# Patient Record
Sex: Female | Born: 1950 | Race: White | Hispanic: No | Smoking: Former smoker
Health system: Southern US, Community
[De-identification: ages and names within clinical notes are randomized; demographics above are authoritative.]

## PROBLEM LIST (undated history)

## (undated) DIAGNOSIS — C801 Malignant (primary) neoplasm, unspecified: Secondary | ICD-10-CM

## (undated) HISTORY — PX: BREAST CYST ASPIRATION: SHX578

## (undated) HISTORY — PX: TUBAL LIGATION: SHX77

## (undated) HISTORY — PX: REPLACEMENT TOTAL KNEE: SUR1224

## (undated) HISTORY — PX: PARTIAL HYSTERECTOMY: SHX80

## (undated) HISTORY — PX: BLADDER SURGERY: SHX569

---

## 1984-07-19 HISTORY — PX: BREAST EXCISIONAL BIOPSY: SUR124

## 2005-03-29 ENCOUNTER — Ambulatory Visit: Payer: Self-pay

## 2006-03-31 ENCOUNTER — Ambulatory Visit: Payer: Self-pay

## 2007-04-03 ENCOUNTER — Ambulatory Visit: Payer: Self-pay

## 2007-09-06 ENCOUNTER — Ambulatory Visit: Payer: Self-pay | Admitting: Internal Medicine

## 2008-04-04 ENCOUNTER — Ambulatory Visit: Payer: Self-pay

## 2008-08-28 ENCOUNTER — Ambulatory Visit: Payer: Self-pay | Admitting: Internal Medicine

## 2009-03-05 ENCOUNTER — Ambulatory Visit: Payer: Self-pay | Admitting: Internal Medicine

## 2009-03-06 ENCOUNTER — Ambulatory Visit: Payer: Self-pay | Admitting: Internal Medicine

## 2009-03-13 ENCOUNTER — Ambulatory Visit: Payer: Self-pay

## 2009-03-14 ENCOUNTER — Ambulatory Visit: Payer: Self-pay

## 2009-05-07 ENCOUNTER — Ambulatory Visit: Payer: Self-pay | Admitting: Gastroenterology

## 2009-05-15 ENCOUNTER — Ambulatory Visit: Payer: Self-pay

## 2009-06-03 ENCOUNTER — Ambulatory Visit: Payer: Self-pay | Admitting: Gastroenterology

## 2009-08-18 ENCOUNTER — Ambulatory Visit: Payer: Self-pay | Admitting: Internal Medicine

## 2009-09-16 ENCOUNTER — Ambulatory Visit: Payer: Self-pay | Admitting: Gastroenterology

## 2009-09-23 ENCOUNTER — Ambulatory Visit: Payer: Self-pay | Admitting: Internal Medicine

## 2009-10-29 ENCOUNTER — Ambulatory Visit: Payer: Self-pay | Admitting: Nurse Practitioner

## 2009-11-28 ENCOUNTER — Ambulatory Visit: Payer: Self-pay | Admitting: Family Medicine

## 2010-04-06 ENCOUNTER — Ambulatory Visit: Payer: Self-pay | Admitting: Internal Medicine

## 2010-05-18 ENCOUNTER — Ambulatory Visit: Payer: Self-pay | Admitting: Nurse Practitioner

## 2011-02-18 ENCOUNTER — Ambulatory Visit: Payer: Self-pay | Admitting: Physical Medicine and Rehabilitation

## 2011-05-20 ENCOUNTER — Ambulatory Visit: Payer: Self-pay | Admitting: Family Medicine

## 2011-06-26 ENCOUNTER — Ambulatory Visit: Payer: Self-pay

## 2011-08-23 ENCOUNTER — Ambulatory Visit: Payer: Self-pay | Admitting: Sports Medicine

## 2011-09-13 ENCOUNTER — Ambulatory Visit: Payer: Self-pay | Admitting: Anesthesiology

## 2011-09-13 DIAGNOSIS — I10 Essential (primary) hypertension: Secondary | ICD-10-CM

## 2011-09-16 ENCOUNTER — Ambulatory Visit: Payer: Self-pay | Admitting: Orthopedic Surgery

## 2012-05-24 ENCOUNTER — Ambulatory Visit: Payer: Self-pay | Admitting: Nurse Practitioner

## 2012-05-30 ENCOUNTER — Ambulatory Visit: Payer: Self-pay | Admitting: Nurse Practitioner

## 2012-06-22 ENCOUNTER — Ambulatory Visit: Payer: Self-pay | Admitting: Family Medicine

## 2012-06-26 ENCOUNTER — Ambulatory Visit: Payer: Self-pay | Admitting: Surgery

## 2012-06-26 HISTORY — PX: BREAST BIOPSY: SHX20

## 2013-02-07 ENCOUNTER — Ambulatory Visit: Payer: Self-pay | Admitting: Surgery

## 2013-11-05 ENCOUNTER — Ambulatory Visit: Payer: Self-pay | Admitting: Physician Assistant

## 2013-11-29 ENCOUNTER — Ambulatory Visit: Payer: Self-pay | Admitting: Nurse Practitioner

## 2014-11-10 NOTE — Op Note (Signed)
PATIENT NAME:  Tina Zuniga, Tina Zuniga MR#:  [id number] DATE OF BIRTH:  1950/12/22  DATE OF PROCEDURE:  09/16/2011  PREOPERATIVE DIAGNOSES:  1. Left knee arthritis. 2. Medial meniscus tear.   POSTOPERATIVE DIAGNOSES:  1. Left knee arthritis. 2. Medial meniscus tear.   PROCEDURES PERFORMED:  1. Left knee arthroscopy. 2. Partial medial meniscectomy.   SURGEON: Laurene Footman, MD  ANESTHESIA: General.  DESCRIPTION OF PROCEDURE: Patient brought to the Operating Room and after adequate anesthesia was obtained left leg was placed in the arthroscopic legholder, prepped and draped in the usual sterile fashion. After patient identification procedures were complete an inferolateral portal was made and the arthroscope was introduced. Initial inspection revealed some mild patellofemoral degenerative change. There was some scar tissue in the medial gutter related to prior open procedure. This was subsequently debrided as it might have been impinging, was not really a plica, just scar in the pouch. Next, the inferomedial portal was made; she had a prior medial arthrotomy and this incision was avoided. On probing there was a complex tear of the posterior horn of the meniscus. There was also an approximately 5 mm flap of articular cartilage on the distal aspect of the femoral condyle. The meniscal tear was addressed with meniscal punch and ArthroCare wand and the chondral flap was also debrided. The meniscus was addressed and after it was deemed stable that portion of the procedure was completed. The articular cartilage on the tibia showed some early degenerative changes with loss of the superficial layer. There was not fissuring, however, and other than the chondral flap on the femoral condyle there were areas of fissuring but no significant thickness loss of the medial femoral condyle. The anterior cruciate ligament was intact. The lateral compartment was essentially normal. After thoroughly irrigating out the knee  the gutters were checked. There were no loose bodies. The patella tracked well and after thorough irrigation of the joint all instrumentation was withdrawn. Dermabond was used to close the wounds. 20 mL of 0.5% Sensorcaine with epinephrine was infiltrated into the portals for postoperative analgesia. The wound was dressed with 4 x 4's, Webril, and Ace wrap and sent to recovery in stable condition.    ____________________________ Laurene Footman, MD mjm:cms D: 09/16/2011 16:19:32 ET T: 09/16/2011 17:01:34 ET JOB#: [id number]  cc: Laurene Footman, MD, <Dictator> Laurene Footman MD ELECTRONICALLY SIGNED 09/16/2011 17:24

## 2014-11-26 ENCOUNTER — Other Ambulatory Visit: Payer: Self-pay | Admitting: Nurse Practitioner

## 2014-11-26 DIAGNOSIS — Z1231 Encounter for screening mammogram for malignant neoplasm of breast: Secondary | ICD-10-CM

## 2014-12-02 ENCOUNTER — Ambulatory Visit
Admission: RE | Admit: 2014-12-02 | Discharge: 2014-12-02 | Disposition: A | Discharge status: Home / Self Care | Payer: Federal, State, Local not specified - PPO | Source: Ambulatory Visit | Attending: Nurse Practitioner | Admitting: Nurse Practitioner

## 2014-12-02 DIAGNOSIS — Z1231 Encounter for screening mammogram for malignant neoplasm of breast: Secondary | ICD-10-CM | POA: Diagnosis present

## 2014-12-02 HISTORY — DX: Malignant (primary) neoplasm, unspecified: C80.1

## 2015-10-31 ENCOUNTER — Other Ambulatory Visit: Payer: Self-pay | Admitting: Nurse Practitioner

## 2015-10-31 DIAGNOSIS — Z1231 Encounter for screening mammogram for malignant neoplasm of breast: Secondary | ICD-10-CM

## 2015-12-04 ENCOUNTER — Ambulatory Visit
Admission: RE | Admit: 2015-12-04 | Discharge: 2015-12-04 | Disposition: A | Discharge status: Home / Self Care | Payer: Federal, State, Local not specified - PPO | Source: Ambulatory Visit | Attending: Nurse Practitioner | Admitting: Nurse Practitioner

## 2015-12-04 DIAGNOSIS — Z1231 Encounter for screening mammogram for malignant neoplasm of breast: Secondary | ICD-10-CM | POA: Insufficient documentation

## 2016-09-16 ENCOUNTER — Emergency Department
Admission: EM | Admit: 2016-09-16 | Discharge: 2016-09-16 | Disposition: A | Discharge status: Home / Self Care | Payer: Federal, State, Local not specified - PPO | Attending: Emergency Medicine | Admitting: Emergency Medicine

## 2016-09-16 ENCOUNTER — Encounter: Payer: Self-pay | Admitting: Emergency Medicine

## 2016-09-16 DIAGNOSIS — K2970 Gastritis, unspecified, without bleeding: Secondary | ICD-10-CM | POA: Diagnosis not present

## 2016-09-16 DIAGNOSIS — R1012 Left upper quadrant pain: Secondary | ICD-10-CM | POA: Diagnosis present

## 2016-09-16 DIAGNOSIS — Z85820 Personal history of malignant melanoma of skin: Secondary | ICD-10-CM | POA: Insufficient documentation

## 2016-09-16 DIAGNOSIS — Z79899 Other long term (current) drug therapy: Secondary | ICD-10-CM | POA: Insufficient documentation

## 2016-09-16 LAB — URINALYSIS, COMPLETE (UACMP) WITH MICROSCOPIC
Bilirubin Urine: NEGATIVE
GLUCOSE, UA: NEGATIVE mg/dL
Hgb urine dipstick: NEGATIVE
Ketones, ur: NEGATIVE mg/dL
Nitrite: NEGATIVE
PH: 6 (ref 5.0–8.0)
Protein, ur: NEGATIVE mg/dL
Specific Gravity, Urine: 1.006 (ref 1.005–1.030)

## 2016-09-16 LAB — COMPREHENSIVE METABOLIC PANEL
ALT: 24 U/L (ref 14–54)
AST: 24 U/L (ref 15–41)
Albumin: 4.2 g/dL (ref 3.5–5.0)
Alkaline Phosphatase: 64 U/L (ref 38–126)
Anion gap: 8 (ref 5–15)
BUN: 16 mg/dL (ref 6–20)
CO2: 29 mmol/L (ref 22–32)
Calcium: 9.3 mg/dL (ref 8.9–10.3)
Chloride: 101 mmol/L (ref 101–111)
Creatinine, Ser: 0.78 mg/dL (ref 0.44–1.00)
GFR calc Af Amer: >60 mL/min (ref >60)
GFR calc non Af Amer: >60 mL/min (ref >60)
Glucose, Bld: 98 mg/dL (ref 65–99)
Potassium: 3.5 mmol/L (ref 3.5–5.1)
Sodium: 138 mmol/L (ref 135–145)
Total Bilirubin: 0.8 mg/dL (ref 0.3–1.2)
Total Protein: 7.7 g/dL (ref 6.5–8.1)

## 2016-09-16 LAB — LIPASE, BLOOD: LIPASE: 39 U/L (ref 11–51)

## 2016-09-16 LAB — CBC
HCT: 38.8 % (ref 35.0–47.0)
Hemoglobin: 13 g/dL (ref 12.0–16.0)
MCH: 28.8 pg (ref 26.0–34.0)
MCHC: 33.4 g/dL (ref 32.0–36.0)
MCV: 86.2 fL (ref 80.0–100.0)
Platelets: 338 10*3/uL (ref 150–440)
RBC: 4.5 MIL/uL (ref 3.80–5.20)
RDW: 14.4 % (ref 11.5–14.5)
WBC: 7.5 10*3/uL (ref 3.6–11.0)

## 2016-09-16 LAB — TROPONIN I: Troponin I: <0.03 ng/mL (ref <0.03)

## 2016-09-16 MED ORDER — FAMOTIDINE 40 MG PO TABS: 40.0000 mg | ORAL_TABLET | Freq: Every evening | ORAL | 1 refills | Status: AC

## 2016-09-16 MED ORDER — SUCRALFATE 1 G PO TABS: 1.0000 g | ORAL_TABLET | Freq: Four times a day (QID) | ORAL | 0 refills | Status: AC

## 2016-09-16 MED ORDER — GI COCKTAIL ~~LOC~~
30.0000 mL | Freq: Once | ORAL | Status: AC
  Administered 2016-09-16: 30 mL via ORAL
  Filled 2016-09-16: qty 30

## 2016-09-16 NOTE — ED Notes (Signed)
ED Provider at bedside. 

## 2016-09-16 NOTE — ED Provider Notes (Signed)
[facility] Emergency Department Provider Note   ____________________________________________   I have reviewed the triage vital signs and the nursing notes.   HISTORY  Chief Complaint Abdominal Pain   History limited by: Not Limited   HPI Tina Zuniga is a 66 y.o. female who presents to the emergency department today because of concerns for abdominal pain. Is located in the left upper quadrant. It started 3 days ago after the patient ate. Has progressively been getting worse. It is worse after the patient eats. She states today the pain was constant. She describes it as sharp. It has been associated with nausea. No radiation. No change in defecation. No fevers.     Past Medical History:  Diagnosis Date  . Cancer ([place])    melanoma    There are no active problems to display for this patient.   Past Surgical History:  Procedure Laterality Date  . BLADDER SURGERY    . BREAST BIOPSY Right 06/26/2012   negative  . BREAST CYST ASPIRATION Left    negative  . PARTIAL HYSTERECTOMY    . REPLACEMENT TOTAL KNEE Left   . TUBAL LIGATION      Prior to Admission medications   Not on File    Allergies Codeine  No family history on file.  Social History No alcohol use No smoking  Review of Systems  Constitutional: Negative for fever. Cardiovascular: Negative for chest pain. Respiratory: Negative for shortness of breath. Gastrointestinal: Positive for left upper quadrant pain Genitourinary: Negative for dysuria. Musculoskeletal: Negative for back pain. Skin: Negative for rash. Neurological: Negative for headaches, focal weakness or numbness.  10-point ROS otherwise negative.  ____________________________________________   PHYSICAL EXAM:  VITAL SIGNS: ED Triage Vitals [09/16/16 1811]  Enc Vitals Group     BP 145/79     Pulse 85     Resp 16     Temp 98.2     Temp src      SpO2 99     Weight 183 lb (83 kg)     Height 5\' 1"   (1.549 m)     Head Circumference      Peak Flow      Pain Score 10   Constitutional: Alert and oriented. Well appearing and in no distress. Eyes: Conjunctivae are normal. Normal extraocular movements. ENT   Head: Normocephalic and atraumatic.   Nose: No congestion/rhinnorhea.   Mouth/Throat: Mucous membranes are moist.   Neck: No stridor. Hematological/Lymphatic/Immunilogical: No cervical lymphadenopathy. Cardiovascular: Normal rate, regular rhythm.  No murmurs, rubs, or gallops.  Respiratory: Normal respiratory effort without tachypnea nor retractions. Breath sounds are clear and equal bilaterally. No wheezes/rales/rhonchi. Gastrointestinal: Soft and non tender. No rebound. No guarding.  Genitourinary: Deferred Musculoskeletal: Normal range of motion in all extremities. No lower extremity edema. Neurologic:  Normal speech and language. No gross focal neurologic deficits are appreciated.  Skin:  Skin is warm, dry and intact. No rash noted. Psychiatric: Mood and affect are normal. Speech and behavior are normal. Patient exhibits appropriate insight and judgment.  ____________________________________________    LABS (pertinent positives/negatives)  Labs Reviewed  URINALYSIS, COMPLETE (UACMP) WITH MICROSCOPIC - Abnormal; Notable for the following:       Result Value   Color, Urine STRAW (*)    APPearance CLEAR (*)    Leukocytes, UA TRACE (*)    Bacteria, UA RARE (*)    Squamous Epithelial / LPF 0-5 (*)    All other components within normal limits  LIPASE, BLOOD  COMPREHENSIVE METABOLIC PANEL  CBC  TROPONIN I     ____________________________________________   EKG  None  ____________________________________________    RADIOLOGY  None  ____________________________________________   PROCEDURES  Procedures  ____________________________________________   INITIAL IMPRESSION / ASSESSMENT AND PLAN / ED COURSE  Pertinent labs & imaging results that  were available during my care of the patient were reviewed by me and considered in my medical decision making (see chart for details).  Patient presented to the emergency department today because of concerns for left upper quadrant pain. This has been going on for 2 days. On exam abdomen is benign. Blood work without any concerning findings. The patient did get improvement after a GI cocktail. At this point I think gastritis likely. Discussed with patient did not think any emergent abdominal imaging is required at this time. Will plan on discharging with an antiacid and sucralfate.  ____________________________________________   FINAL CLINICAL IMPRESSION(S) / ED DIAGNOSES  Final diagnoses:  Gastritis without bleeding, unspecified chronicity, unspecified gastritis type     Note: This dictation was prepared with Dragon dictation. Any transcriptional errors that result from this process are unintentional     Nance Pear, MD 09/16/16 2245

## 2016-09-16 NOTE — Discharge Instructions (Signed)
Please seek medical attention for any high fevers, chest pain, shortness of breath, change in behavior, persistent vomiting, bloody stool or any other new or concerning symptoms.  

## 2016-09-16 NOTE — ED Notes (Signed)
D/c inst to pt.   

## 2016-09-16 NOTE — ED Triage Notes (Signed)
Pt reports LUQ pain x2 days, reports pain has been consistent today. Pt reports nausea today, reports pain is worse after eating.

## 2016-10-22 ENCOUNTER — Other Ambulatory Visit: Payer: Self-pay | Admitting: Nurse Practitioner

## 2016-10-22 DIAGNOSIS — Z1231 Encounter for screening mammogram for malignant neoplasm of breast: Secondary | ICD-10-CM

## 2016-12-06 ENCOUNTER — Ambulatory Visit
Admission: RE | Admit: 2016-12-06 | Discharge: 2016-12-06 | Disposition: A | Discharge status: Home / Self Care | Payer: Medicare Other | Source: Ambulatory Visit | Attending: Nurse Practitioner | Admitting: Nurse Practitioner

## 2016-12-06 DIAGNOSIS — Z1231 Encounter for screening mammogram for malignant neoplasm of breast: Secondary | ICD-10-CM | POA: Insufficient documentation

## 2017-02-02 ENCOUNTER — Other Ambulatory Visit
Admission: RE | Admit: 2017-02-02 | Discharge: 2017-02-02 | Disposition: A | Discharge status: Home / Self Care | Payer: Federal, State, Local not specified - PPO | Source: Ambulatory Visit | Attending: Unknown Physician Specialty | Admitting: Unknown Physician Specialty

## 2017-02-02 DIAGNOSIS — R42 Dizziness and giddiness: Secondary | ICD-10-CM | POA: Insufficient documentation

## 2017-02-02 LAB — BASIC METABOLIC PANEL
ANION GAP: 9 (ref 5–15)
BUN: 19 mg/dL (ref 6–20)
CALCIUM: 9.3 mg/dL (ref 8.9–10.3)
CO2: 29 mmol/L (ref 22–32)
CREATININE: 0.74 mg/dL (ref 0.44–1.00)
Chloride: 104 mmol/L (ref 101–111)
GLUCOSE: 98 mg/dL (ref 65–99)
Potassium: 3.5 mmol/L (ref 3.5–5.1)
Sodium: 142 mmol/L (ref 135–145)

## 2017-02-03 ENCOUNTER — Emergency Department: Payer: Federal, State, Local not specified - PPO

## 2017-02-03 ENCOUNTER — Emergency Department
Admission: EM | Admit: 2017-02-03 | Discharge: 2017-02-03 | Disposition: A | Discharge status: Home / Self Care | Payer: Federal, State, Local not specified - PPO | Attending: Emergency Medicine | Admitting: Emergency Medicine

## 2017-02-03 DIAGNOSIS — Z79899 Other long term (current) drug therapy: Secondary | ICD-10-CM | POA: Insufficient documentation

## 2017-02-03 DIAGNOSIS — R55 Syncope and collapse: Secondary | ICD-10-CM

## 2017-02-03 DIAGNOSIS — H02402 Unspecified ptosis of left eyelid: Secondary | ICD-10-CM | POA: Insufficient documentation

## 2017-02-03 DIAGNOSIS — H538 Other visual disturbances: Secondary | ICD-10-CM

## 2017-02-03 DIAGNOSIS — Z87891 Personal history of nicotine dependence: Secondary | ICD-10-CM | POA: Insufficient documentation

## 2017-02-03 DIAGNOSIS — Z96652 Presence of left artificial knee joint: Secondary | ICD-10-CM | POA: Insufficient documentation

## 2017-02-03 LAB — URINALYSIS, COMPLETE (UACMP) WITH MICROSCOPIC
BACTERIA UA: NONE SEEN
BILIRUBIN URINE: NEGATIVE
Glucose, UA: NEGATIVE mg/dL
HGB URINE DIPSTICK: NEGATIVE
Ketones, ur: NEGATIVE mg/dL
NITRITE: NEGATIVE
PROTEIN: NEGATIVE mg/dL
SPECIFIC GRAVITY, URINE: 1.004 — AB (ref 1.005–1.030)
pH: 7 (ref 5.0–8.0)

## 2017-02-03 LAB — BASIC METABOLIC PANEL
ANION GAP: 10 (ref 5–15)
BUN: 14 mg/dL (ref 6–20)
CO2: 27 mmol/L (ref 22–32)
Calcium: 9.6 mg/dL (ref 8.9–10.3)
Chloride: 102 mmol/L (ref 101–111)
Creatinine, Ser: 0.66 mg/dL (ref 0.44–1.00)
GLUCOSE: 99 mg/dL (ref 65–99)
POTASSIUM: 3.4 mmol/L — AB (ref 3.5–5.1)
Sodium: 139 mmol/L (ref 135–145)

## 2017-02-03 LAB — CBC
HEMATOCRIT: 38.4 % (ref 35.0–47.0)
HEMOGLOBIN: 13.1 g/dL (ref 12.0–16.0)
MCH: 28.7 pg (ref 26.0–34.0)
MCHC: 34 g/dL (ref 32.0–36.0)
MCV: 84.4 fL (ref 80.0–100.0)
Platelets: 378 10*3/uL (ref 150–440)
RBC: 4.55 MIL/uL (ref 3.80–5.20)
RDW: 15.4 % — ABNORMAL HIGH (ref 11.5–14.5)
WBC: 8.5 10*3/uL (ref 3.6–11.0)

## 2017-02-03 LAB — TROPONIN I

## 2017-02-03 MED ORDER — ASPIRIN 81 MG PO CHEW
324.0000 mg | CHEWABLE_TABLET | Freq: Once | ORAL | Status: AC
  Administered 2017-02-03: 324 mg via ORAL
  Filled 2017-02-03: qty 4

## 2017-02-03 NOTE — ED Notes (Signed)
Pt updated and verbalized feeling safe being discharged to follow up tomorrow. PT able to ambulate independently and is in NAD at the time of discharge. PT able to ambulate to parking lot independently

## 2017-02-03 NOTE — ED Notes (Signed)
Dizzy x4 days , blurred vision today , seen at "walk in yesterday for same"

## 2017-02-03 NOTE — ED Notes (Signed)
EDP at bedside  

## 2017-02-03 NOTE — ED Notes (Signed)
Security notified of need to call MRI tech

## 2017-02-03 NOTE — ED Triage Notes (Signed)
Pt reports to ED w/ c/o multiple LOC x 2 days. Pt sts that she has had 3 episodes of LOC in the past 2 days, pt reports feeling dizzy at this time. Pt A/OX4, resp even and unlabored. Pt reports L eyelid drooping at 1200 today, no drooping noted by this RN. Pts stroke screen negative. MAE. NAD

## 2017-02-03 NOTE — ED Notes (Signed)
Pt on phone with MRI for screening 

## 2017-02-03 NOTE — ED Notes (Signed)
PT back from MRI. NAD noted at this time.

## 2017-02-03 NOTE — ED Provider Notes (Addendum)
[facility] Emergency Department Provider Note  ____________________________________________   First MD Initiated Contact with Patient 02/03/17 1819     (approximate)  I have reviewed the triage vital signs and the nursing notes.   HISTORY  Chief Complaint Loss of Consciousness and Eye Problem   HPI Tina Zuniga is a 66 y.o. female who is presenting to the emergency Department today with left-sided blurred vision as well as left-sided eye drooping. She is also complaining of one episode of syncope 2 days ago as well as intermittent feelings of lightheadedness since then. She says that the first episode of syncope occurred when she was sitting up in bed 2 days ago after waking up in the morning. Says that she sat up and felt lightheaded and then came diaphoretic and lost consciousness. She is unsure how long the loss of consciousness lasted. She then said that she has had several other episodes of lightheadedness but without any syncope since then. She is denying any chest pain. However, she said that her left eye began to have blurred vision and that other people at work were noticing a left-sided eye droop at about noon today.The patient denies any pain to her eye. Denies any double vision but says that the vision is blurred left eye. Does not report bright light intolerance.   Past Medical History:  Diagnosis Date  . Cancer ([place])    melanoma    There are no active problems to display for this patient.   Past Surgical History:  Procedure Laterality Date  . BLADDER SURGERY    . BREAST BIOPSY Right 06/26/2012   negative  . BREAST CYST ASPIRATION Left    negative  . PARTIAL HYSTERECTOMY    . REPLACEMENT TOTAL KNEE Left   . TUBAL LIGATION      Prior to Admission medications   Medication Sig Start Date End Date Taking? Authorizing Provider  cetirizine (ZYRTEC) 10 MG tablet Take 10 mg by mouth at bedtime.   Yes [provider]    Cholecalciferol (VITAMIN D3) 1000 units CAPS Take 1,000 Units by mouth daily.   Yes [provider]  CRANBERRY PO Take 1 tablet by mouth daily.   Yes [provider]  furosemide (LASIX) 20 MG tablet Take 10 mg by mouth as needed.   Yes [provider]  Multiple Vitamin (MULTI-VITAMINS) TABS Take 1 tablet by mouth daily.   Yes [provider]  nortriptyline (PAMELOR) 50 MG capsule Take 50 mg by mouth at bedtime. 08/31/16  Yes [provider]  Potassium 99 MG TABS Take 1 tablet by mouth daily.   Yes [provider]  Propylene Glycol (SYSTANE BALANCE OP) Apply 1 drop to eye as needed.   Yes [provider]  simvastatin (ZOCOR) 10 MG tablet Take 10 mg by mouth daily at 6 PM.  09/12/16  Yes [provider]  triamterene-hydrochlorothiazide (MAXZIDE-25) 37.5-25 MG tablet Take 1 tablet by mouth daily. 09/01/16  Yes [provider]  valACYclovir (VALTREX) 500 MG tablet Take 500 mg by mouth 2 (two) times daily as needed. 08/31/16  Yes [provider]  vitamin B-12 (CYANOCOBALAMIN) 1000 MCG tablet Take 1,000 mcg by mouth daily.   Yes [provider]  cycloSPORINE (RESTASIS) 0.05 % ophthalmic emulsion Place 1 drop into both eyes 2 (two) times daily.    [provider]  famotidine (PEPCID) 40 MG tablet Take 1 tablet (40 mg total) by mouth every evening. Patient not taking: Reported on  02/03/2017 09/16/16 09/16/17  Nance Pear, MD  sucralfate (CARAFATE) 1 g tablet Take 1 tablet (1 g total) by mouth 4 (four) times daily. Patient not taking: Reported on 02/03/2017 09/16/16   Nance Pear, MD    Allergies Codeine  No family history on file.  Social History Social History  Substance Use Topics  . Smoking status: Former Research scientist (life sciences)  . Smokeless tobacco: Never Used  . Alcohol use Not on file    Review of Systems  Constitutional: No fever/chills Eyes: as above ENT: No sore throat. Cardiovascular:  Denies chest pain. Respiratory: Denies shortness of breath. Gastrointestinal: No abdominal pain.  No nausea, no vomiting.  No diarrhea.  No constipation. Genitourinary: Negative for dysuria. Musculoskeletal: Negative for back pain. Skin: Negative for rash. Neurological: Negative for headaches or numbness.   ____________________________________________   PHYSICAL EXAM:  VITAL SIGNS: ED Triage Vitals  Enc Vitals Group     BP 02/03/17 1619 (!) 158/98     Pulse Rate 02/03/17 1619 75     Resp 02/03/17 1619 (!) 22     Temp 02/03/17 1619 98.3 F (36.8 C)     Temp Source 02/03/17 1619 Oral     SpO2 02/03/17 1619 98 %     Weight 02/03/17 1620 190 lb (86.2 kg)     Height 02/03/17 1620 5\' 1"  (1.549 m)     Head Circumference --      Peak Flow --      Pain Score 02/03/17 1626 2     Pain Loc --      Pain Edu? --      Excl. in [place]? --     Constitutional: Alert and oriented. Well appearing and in no acute distress. Eyes: Conjunctivae are normal. PERRLA. Left eye is 20/200 with vision corrected in the right eye is 20/40 with the vision corrected by glasses. Head: Atraumatic.Normal TMs bilaterally without lesions Nose: No congestion/rhinnorhea. Mouth/Throat: Mucous membranes are moist.  Neck: No stridor.   Cardiovascular: Normal rate, regular rhythm. Grossly normal heart sounds.  Good peripheral circulation. Respiratory: Normal respiratory effort.  No retractions. Lungs CTAB. Gastrointestinal: Soft and nontender. No distention.  Musculoskeletal: No lower extremity tenderness nor edema.  No joint effusions. Neurologic:  Normal speech and language. No gross focal neurologic deficits are appreciated. I do not see any asymmetry in the patient's face.  To me, the patient does not appear to have a central or peripheral left-sided cranial nerve VII palsy. However, she says that when she looks in the mirror and appears that her left eye is drooping and she is feeling that she cannot fully open her  left eyelid.  Skin:  Skin is warm, dry and intact. No rash noted. Psychiatric: Mood and affect are normal. Speech and behavior are normal.  NIH Stroke Scale  Person Administering Scale: Doran Stabler  Administer stroke scale items in the order listed. Record performance in each category after each subscale exam. Do not go back and change scores. Follow directions provided for each exam technique. Scores should reflect what the patient does, not what the clinician thinks the patient can do. The clinician should record answers while administering the exam and work quickly. Except where indicated, the patient should not be coached (i.e., repeated requests to patient to make a special effort).   1a  Level of consciousness: 0=alert; keenly responsive  1b. LOC questions:  0=Performs both tasks correctly  1c. LOC commands: 0=Performs both tasks correctly  2.  Best Gaze: 0=normal  3.  Visual: 0=No visual loss  4. Facial Palsy: 0=Normal symmetric movement  5a.  Motor left arm: 0=No drift, limb holds 90 (or 45) degrees for full 10 seconds  5b.  Motor right arm: 0=No drift, limb holds 90 (or 45) degrees for full 10 seconds  6a. motor left leg: 0=No drift, limb holds 90 (or 45) degrees for full 10 seconds  6b  Motor right leg:  0=No drift, limb holds 90 (or 45) degrees for full 10 seconds  7. Limb Ataxia: 0=Absent  8.  Sensory: 0=Normal; no sensory loss  9. Best Language:  0=No aphasia, normal  10. Dysarthria: 0=Normal  11. Extinction and Inattention: 0=No abnormality  12. Distal motor function: 0=Normal   Total:   0    ____________________________________________   LABS (all labs ordered are listed, but only abnormal results are displayed)  Labs Reviewed  BASIC METABOLIC PANEL - Abnormal; Notable for the following:       Result Value   Potassium 3.4 (*)    All other components within normal limits  CBC - Abnormal; Notable for the following:    RDW 15.4 (*)    All other  components within normal limits  URINALYSIS, COMPLETE (UACMP) WITH MICROSCOPIC - Abnormal; Notable for the following:    Color, Urine STRAW (*)    APPearance CLEAR (*)    Specific Gravity, Urine 1.004 (*)    Leukocytes, UA TRACE (*)    Squamous Epithelial / LPF 0-5 (*)    All other components within normal limits  TROPONIN I  CBG MONITORING, ED   ____________________________________________  EKG  ED ECG REPORT I, Doran Stabler, the attending physician, personally viewed and interpreted this ECG.   Date: 02/03/2017  EKG Time: 1614  Rate: 73  Rhythm: normal sinus rhythm  Axis: Normal  Intervals:none  ST&T Change: No ST segment elevation or depression. No abnormal T-wave inversion.  ____________________________________________  RADIOLOGY  No acute finding on the CT of the brain.  No acute finding on the MRI of the brain. ____________________________________________   PROCEDURES  Procedure(s) performed:   Procedures  Critical Care performed:   ____________________________________________   INITIAL IMPRESSION / ASSESSMENT AND PLAN / ED COURSE  Pertinent labs & imaging results that were available during my care of the patient were reviewed by me and considered in my medical decision making (see chart for details).  ----------------------------------------- 10:09 PM on 02/03/2017 -----------------------------------------  Patient persistently is without any weakness or numbness. I continue to observe an intact cranial nerve VII. She says that her vision is slightly improved although still blurred the left eye. I discussed the case with Dr. Wallace Going of ophthalmology and he agrees to the patient in his office tomorrow at 8 AM. I discussed this plan with the patient as well as the lab and imaging results and she is understanding and will comply will be discharged home. We also discussed the syncopal episodes and she'll be following up at her doctor's office this  coming Wednesday. She had a reassuring cardiac workup. Possible vasovagal episodes.      ____________________________________________   FINAL CLINICAL IMPRESSION(S) / ED DIAGNOSES  Syncope. Blurred vision.    NEW MEDICATIONS STARTED DURING THIS VISIT:  New Prescriptions   No medications on file     Note:  This document was prepared using Dragon voice recognition software and may include unintentional dictation errors.     Orbie Pyo, MD 02/03/17 [place], Randall An, MD 02/03/17  2333  

## 2017-02-04 ENCOUNTER — Other Ambulatory Visit
Admission: RE | Admit: 2017-02-04 | Discharge: 2017-02-04 | Disposition: A | Discharge status: Home / Self Care | Payer: Federal, State, Local not specified - PPO | Source: Ambulatory Visit | Attending: Ophthalmology | Admitting: Ophthalmology

## 2017-02-04 ENCOUNTER — Other Ambulatory Visit: Payer: Self-pay | Admitting: Ophthalmology

## 2017-02-04 DIAGNOSIS — R51 Headache: Secondary | ICD-10-CM | POA: Diagnosis not present

## 2017-02-04 DIAGNOSIS — H539 Unspecified visual disturbance: Secondary | ICD-10-CM | POA: Diagnosis not present

## 2017-02-04 DIAGNOSIS — H547 Unspecified visual loss: Secondary | ICD-10-CM

## 2017-02-04 LAB — C-REACTIVE PROTEIN

## 2017-02-04 LAB — SEDIMENTATION RATE: Sed Rate: 52 mm/hr — ABNORMAL HIGH (ref 0–30)

## 2017-02-07 ENCOUNTER — Ambulatory Visit
Admission: RE | Admit: 2017-02-07 | Discharge: 2017-02-07 | Disposition: A | Discharge status: Home / Self Care | Payer: Federal, State, Local not specified - PPO | Source: Ambulatory Visit | Attending: Ophthalmology | Admitting: Ophthalmology

## 2017-02-07 DIAGNOSIS — H547 Unspecified visual loss: Secondary | ICD-10-CM

## 2017-02-07 DIAGNOSIS — H53129 Transient visual loss, unspecified eye: Secondary | ICD-10-CM | POA: Diagnosis not present

## 2017-11-10 ENCOUNTER — Other Ambulatory Visit: Payer: Self-pay | Admitting: Nurse Practitioner

## 2017-11-10 DIAGNOSIS — Z1231 Encounter for screening mammogram for malignant neoplasm of breast: Secondary | ICD-10-CM

## 2017-12-07 ENCOUNTER — Ambulatory Visit
Admission: RE | Admit: 2017-12-07 | Discharge: 2017-12-07 | Disposition: A | Discharge status: Home / Self Care | Payer: Federal, State, Local not specified - PPO | Source: Ambulatory Visit | Attending: Nurse Practitioner | Admitting: Nurse Practitioner

## 2017-12-07 DIAGNOSIS — Z1231 Encounter for screening mammogram for malignant neoplasm of breast: Secondary | ICD-10-CM

## 2018-06-11 IMAGING — MR MR HEAD W/O CM
10 series · 40 of 48 positions shown · non-contrast
Comparison: 02/03/2017 CT head.  11/28/2009 MRI head.

CLINICAL DATA: 66 y/o  F; left-sided facial droop.

EXAM:
MRI HEAD WITHOUT CONTRAST
TECHNIQUE: Multiplanar, multiecho pulse sequences of the brain and surrounding
structures were obtained without intravenous contrast.

[Series 2: T1 · sagittal · 5.0mm · 0.47mm/px · 2 of 21 slices shown (1 of 2)]
[im 1/21]
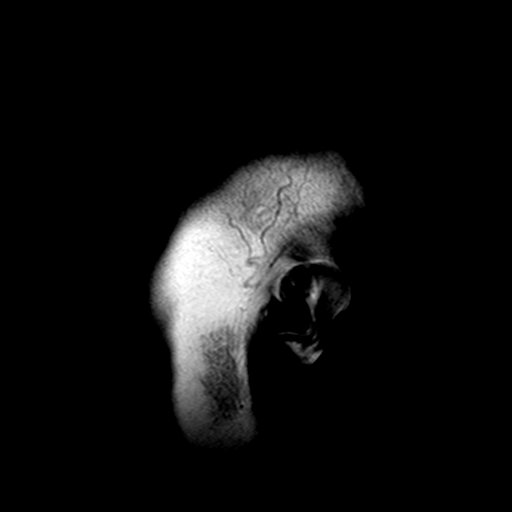
[im 21/21]
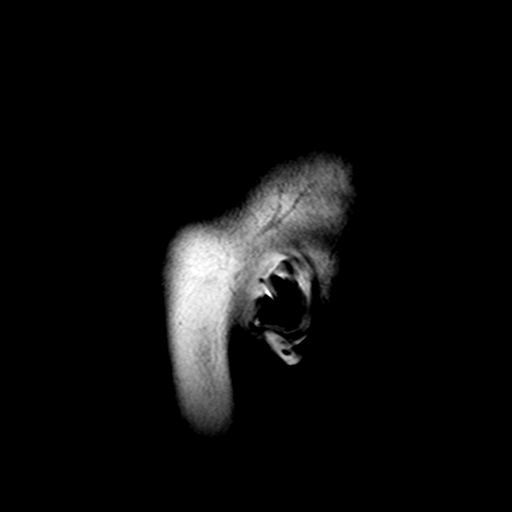

[Series 4: DWI · axial · 3.0mm · 0.94mm/px · z∈[-52,+91]mm · 5 of 50 slices shown (1 of 2)]
[im 1/50]
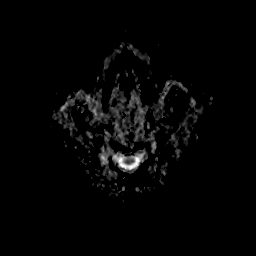
[im 13/50]
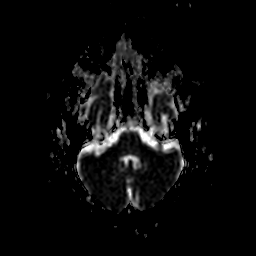
[im 25/50]
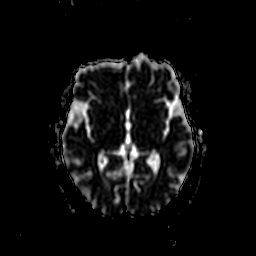
[im 37/50]
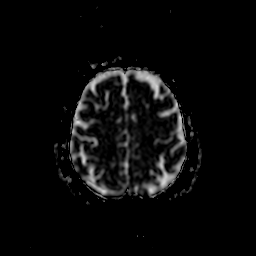
[im 50/50]
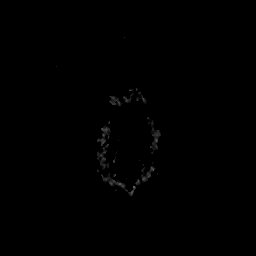

[Series 5: ax (id) · axial · 3.0mm · 0.94mm/px · z∈[-52,+91]mm · 5 of 50 slices shown]
[im 1/50]
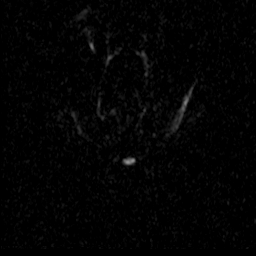
[im 13/50]
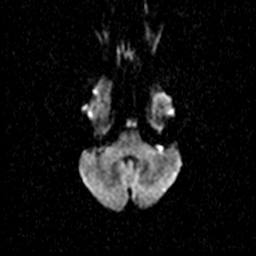
[im 25/50]
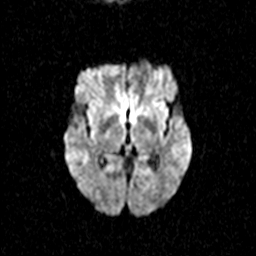
[im 37/50]
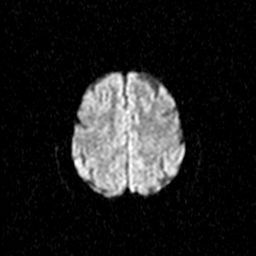
[im 50/50]
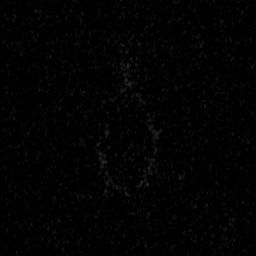

[Series 7: DWI · coronal · 5.0mm · 1.80mm/px · 4 of 39 slices shown (2 of 2)]
[im 1/39]
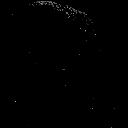
[im 13/39]
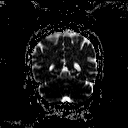
[im 26/39]
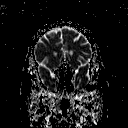
[im 39/39]
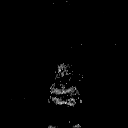

[Series 8: T2 · axial · 5.0mm · 0.45mm/px · z∈[-56,+93]mm · 2 of 23 slices shown (1 of 3)]
[im 1/23]
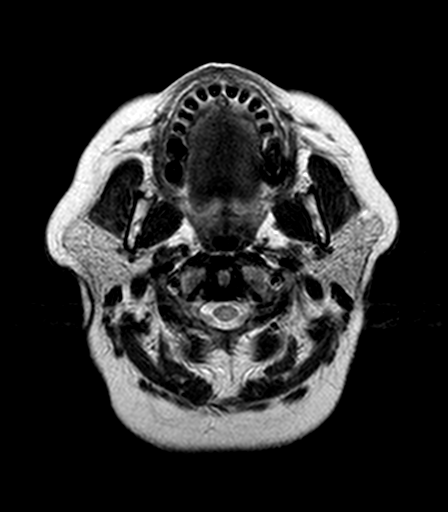
[im 23/23]
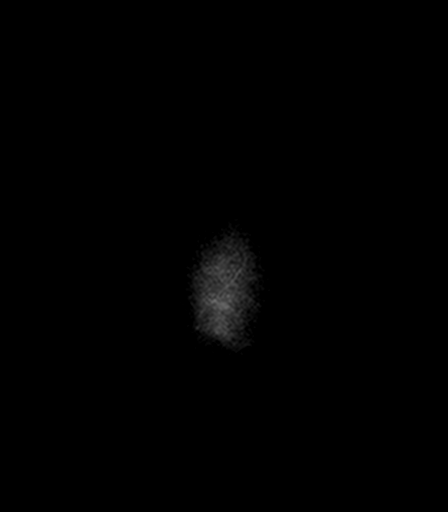

[Series 9: cor (id) · coronal · 5.0mm · 1.80mm/px · 4 of 38 slices shown]
[im 1/38]
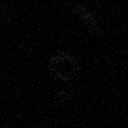
[im 13/38]
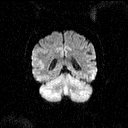
[im 25/38]
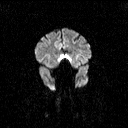
[im 38/38]
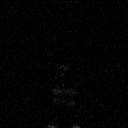

[Series 10: FLAIR · axial · 3.0mm · 0.90mm/px · z∈[-55,+87]mm · 5 of 50 slices shown]
[im 1/50]
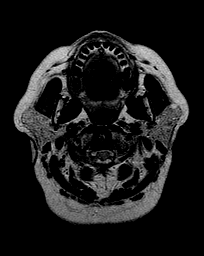
[im 13/50]
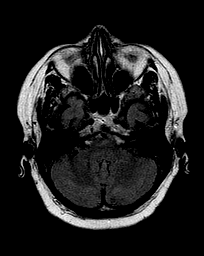
[im 25/50]
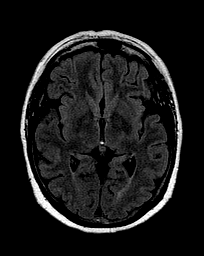
[im 37/50]
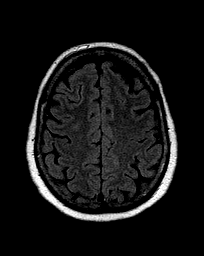
[im 50/50]
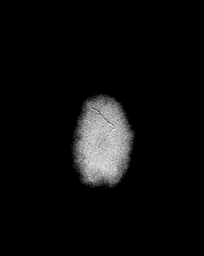

[Series 11: T2 · axial · 5.0mm · 0.45mm/px · z∈[-56,+93]mm · 2 of 23 slices shown (2 of 3)]
[im 1/23]
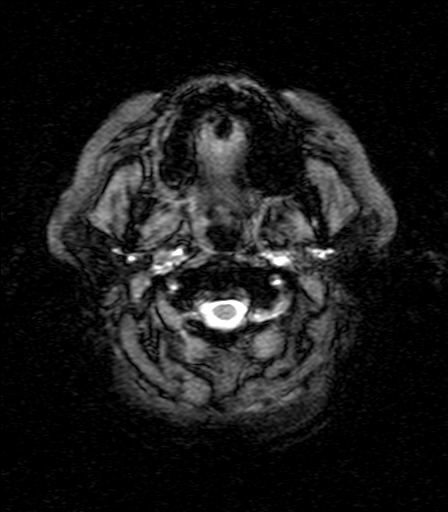
[im 23/23]
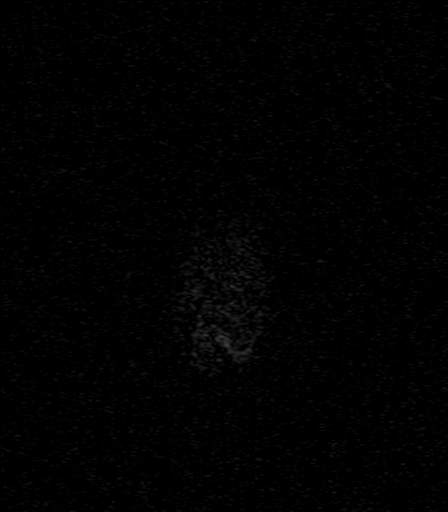

[Series 12: T1 · axial · 1.0mm · 0.45mm/px · z∈[-49,+95]mm · 8 of 160 slices shown (2 of 2)]
[im 11/160]
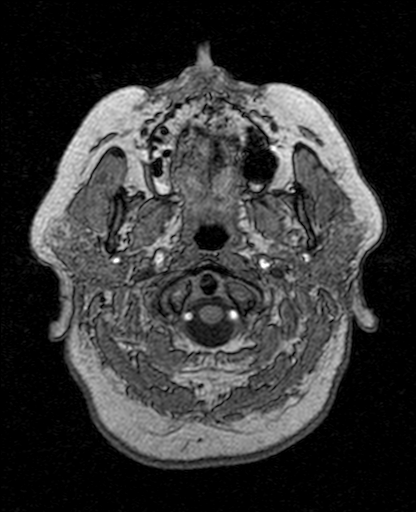
[im 32/160]
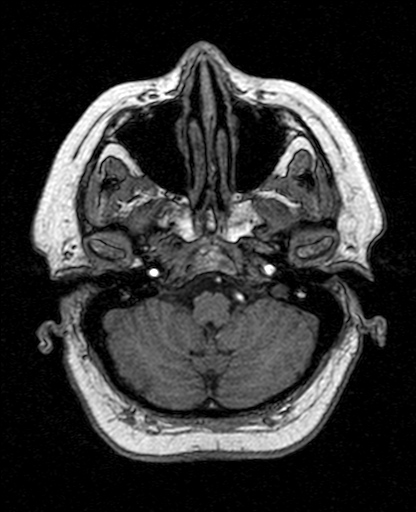
[im 54/160]
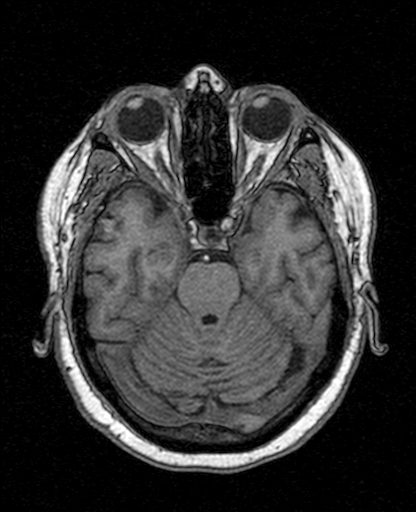
[im 75/160]
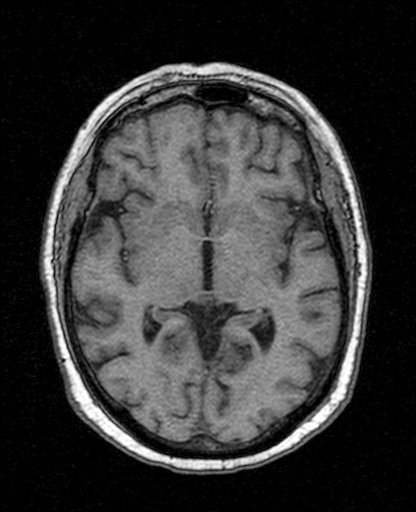
[im 96/160]
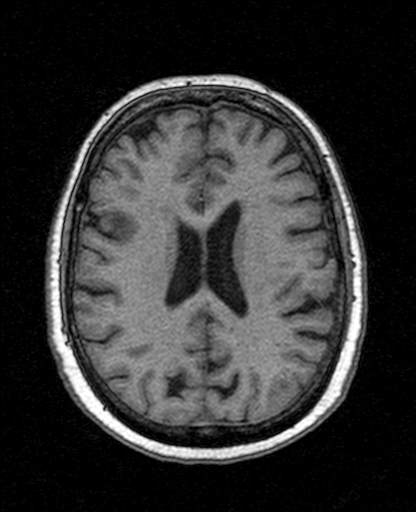
[im 117/160]
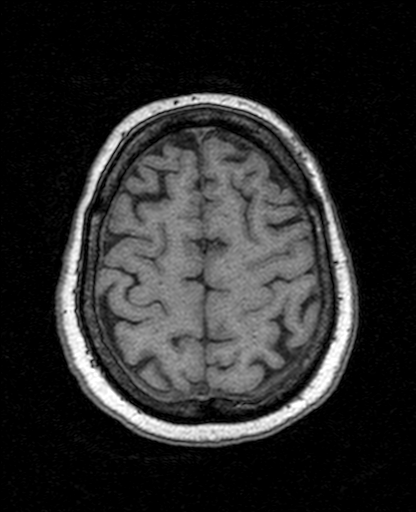
[im 138/160]
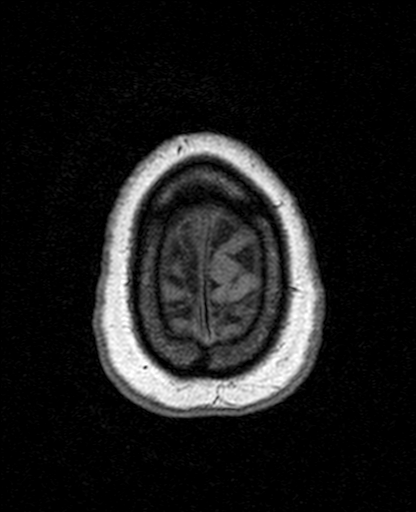
[im 160/160]
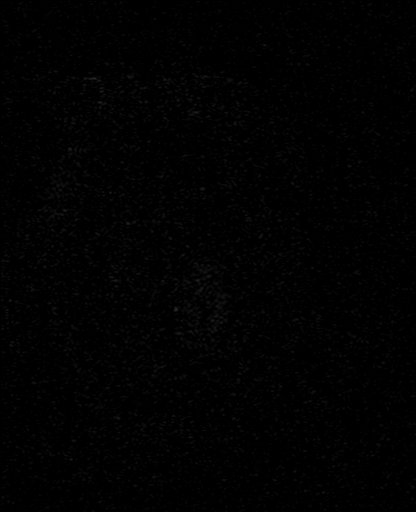

[Series 13: T2 · coronal · 5.0mm · 0.45mm/px · 3 of 27 slices shown (3 of 3)]
[im 1/27]
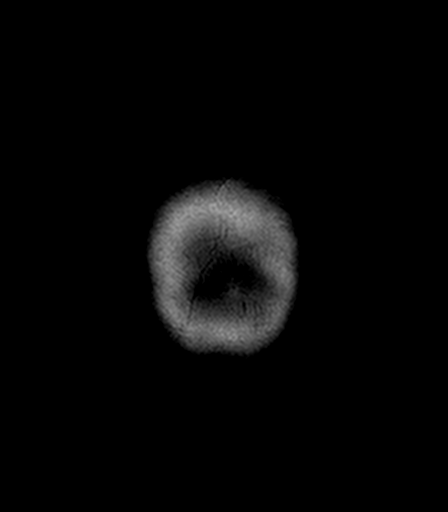
[im 14/27]
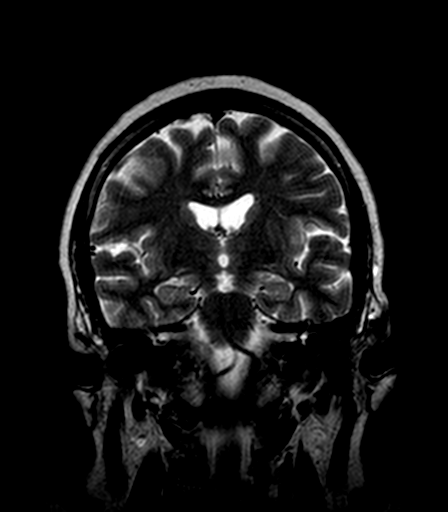
[im 27/27]
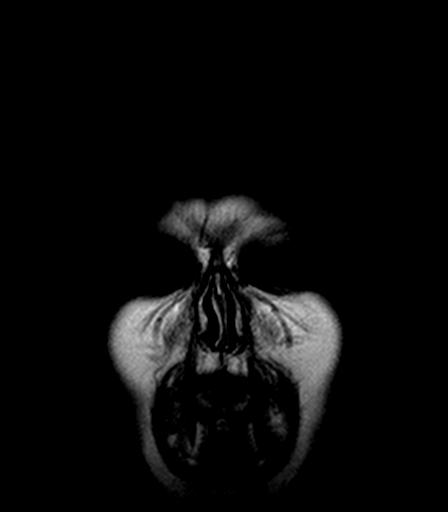

[40 of 48 positions shown; findings below may reference images not displayed]

FINDINGS: Brain: No acute infarction, hemorrhage, hydrocephalus, extra-axial
collection or mass lesion. Few nonspecific foci of T2 FLAIR
hyperintense signal abnormality in periventricular white matter are
compatible with mild chronic microvascular ischemic changes.

Vascular: Normal flow voids.

Skull and upper cervical spine: Normal marrow signal.

Sinuses/Orbits: Negative.

Other: None.
IMPRESSION: 1. No acute intracranial abnormality identified.
2. Mild chronic microvascular ischemic changes of the brain.

By: Sorin Oxendine M.D.

## 2018-11-06 ENCOUNTER — Other Ambulatory Visit: Payer: Self-pay | Admitting: Nurse Practitioner

## 2018-11-06 DIAGNOSIS — Z1231 Encounter for screening mammogram for malignant neoplasm of breast: Secondary | ICD-10-CM

## 2019-01-02 ENCOUNTER — Ambulatory Visit
Admission: RE | Admit: 2019-01-02 | Discharge: 2019-01-02 | Disposition: A | Discharge status: Home / Self Care | Payer: Federal, State, Local not specified - PPO | Source: Ambulatory Visit | Attending: Nurse Practitioner | Admitting: Nurse Practitioner

## 2019-01-02 ENCOUNTER — Other Ambulatory Visit: Payer: Self-pay

## 2019-01-02 DIAGNOSIS — Z1231 Encounter for screening mammogram for malignant neoplasm of breast: Secondary | ICD-10-CM | POA: Diagnosis not present
